# Patient Record
Sex: Female | Born: 1955 | Race: White | Hispanic: No | Smoking: Never smoker
Health system: Southern US, Community
[De-identification: ages and names within clinical notes are randomized; demographics above are authoritative.]

---

## 2017-12-14 ENCOUNTER — Inpatient Hospital Stay (HOSPITAL_COMMUNITY)
Admission: EM | Admit: 2017-12-14 | Discharge: 2017-12-16 | DRG: 084 | Disposition: A | Discharge status: Home Health | Payer: BLUE CROSS/BLUE SHIELD | Attending: Neurosurgery | Admitting: Neurosurgery

## 2017-12-14 ENCOUNTER — Emergency Department (HOSPITAL_COMMUNITY): Payer: BLUE CROSS/BLUE SHIELD

## 2017-12-14 DIAGNOSIS — Y93K1 Activity, walking an animal: Secondary | ICD-10-CM | POA: Diagnosis not present

## 2017-12-14 DIAGNOSIS — W1830XA Fall on same level, unspecified, initial encounter: Secondary | ICD-10-CM | POA: Diagnosis present

## 2017-12-14 DIAGNOSIS — S066X9A Traumatic subarachnoid hemorrhage with loss of consciousness of unspecified duration, initial encounter: Principal | ICD-10-CM | POA: Diagnosis present

## 2017-12-14 DIAGNOSIS — S069X1A Unspecified intracranial injury with loss of consciousness of 30 minutes or less, initial encounter: Secondary | ICD-10-CM

## 2017-12-14 DIAGNOSIS — S069X9A Unspecified intracranial injury with loss of consciousness of unspecified duration, initial encounter: Secondary | ICD-10-CM | POA: Diagnosis present

## 2017-12-14 DIAGNOSIS — S069XAA Unspecified intracranial injury with loss of consciousness status unknown, initial encounter: Secondary | ICD-10-CM | POA: Diagnosis present

## 2017-12-14 LAB — CDS SEROLOGY

## 2017-12-14 LAB — I-STAT CHEM 8, ED
BUN: 32 mg/dL — AB (ref 8–23)
CALCIUM ION: 1.18 mmol/L (ref 1.15–1.40)
CREATININE: 1.4 mg/dL — AB (ref 0.44–1.00)
Chloride: 104 mmol/L (ref 98–111)
GLUCOSE: 232 mg/dL — AB (ref 70–99)
HEMATOCRIT: 41 % (ref 36.0–46.0)
HEMOGLOBIN: 13.9 g/dL (ref 12.0–15.0)
Potassium: 5.4 mmol/L — ABNORMAL HIGH (ref 3.5–5.1)
Sodium: 136 mmol/L (ref 135–145)
TCO2: 23 mmol/L (ref 22–32)

## 2017-12-14 LAB — COMPREHENSIVE METABOLIC PANEL
ALBUMIN: 3.9 g/dL (ref 3.5–5.0)
ALT: 49 U/L — ABNORMAL HIGH (ref 0–44)
ANION GAP: 10 (ref 5–15)
AST: 39 U/L (ref 15–41)
Alkaline Phosphatase: 69 U/L (ref 38–126)
BILIRUBIN TOTAL: 1 mg/dL (ref 0.3–1.2)
BUN: 30 mg/dL — ABNORMAL HIGH (ref 8–23)
CO2: 25 mmol/L (ref 22–32)
Calcium: 9.5 mg/dL (ref 8.9–10.3)
Chloride: 101 mmol/L (ref 98–111)
Creatinine, Ser: 1.42 mg/dL — ABNORMAL HIGH (ref 0.44–1.00)
GFR calc Af Amer: 45 mL/min — ABNORMAL LOW (ref >60)
GFR calc non Af Amer: 39 mL/min — ABNORMAL LOW (ref >60)
GLUCOSE: 243 mg/dL — AB (ref 70–99)
POTASSIUM: 5.5 mmol/L — AB (ref 3.5–5.1)
Sodium: 136 mmol/L (ref 135–145)
TOTAL PROTEIN: 6.4 g/dL — AB (ref 6.5–8.1)

## 2017-12-14 LAB — CBC
HEMATOCRIT: 42.2 % (ref 36.0–46.0)
HEMOGLOBIN: 13.6 g/dL (ref 12.0–15.0)
MCH: 29.7 pg (ref 26.0–34.0)
MCHC: 32.2 g/dL (ref 30.0–36.0)
MCV: 92.1 fL (ref 78.0–100.0)
Platelets: 191 10*3/uL (ref 150–400)
RBC: 4.58 MIL/uL (ref 3.87–5.11)
RDW: 12.3 % (ref 11.5–15.5)
WBC: 8.8 10*3/uL (ref 4.0–10.5)

## 2017-12-14 LAB — PROTIME-INR
INR: 1.16
Prothrombin Time: 14.7 seconds (ref 11.4–15.2)

## 2017-12-14 LAB — TYPE AND SCREEN
ABO/RH(D): A POS
ANTIBODY SCREEN: NEGATIVE

## 2017-12-14 LAB — I-STAT CG4 LACTIC ACID, ED: LACTIC ACID, VENOUS: 0.8 mmol/L (ref 0.5–1.9)

## 2017-12-14 LAB — ETHANOL: Alcohol, Ethyl (B): <10 mg/dL (ref <10)

## 2017-12-14 MED ORDER — SODIUM CHLORIDE 0.9 % IV BOLUS
1000.0000 mL | Freq: Once | INTRAVENOUS | Status: AC
  Administered 2017-12-14: 1000 mL via INTRAVENOUS

## 2017-12-14 MED ORDER — FAMOTIDINE IN NACL 20-0.9 MG/50ML-% IV SOLN
20.0000 mg | Freq: Two times a day (BID) | INTRAVENOUS | Status: DC
  Administered 2017-12-14: 20 mg via INTRAVENOUS
  Filled 2017-12-14 (×3): qty 50

## 2017-12-14 MED ORDER — ONDANSETRON 4 MG PO TBDP: 4.0000 mg | ORAL_TABLET | Freq: Four times a day (QID) | ORAL | Status: DC | PRN

## 2017-12-14 MED ORDER — PANTOPRAZOLE SODIUM 40 MG PO TBEC
40.0000 mg | DELAYED_RELEASE_TABLET | Freq: Two times a day (BID) | ORAL | Status: DC
  Administered 2017-12-15 – 2017-12-16 (×3): 40 mg via ORAL
  Filled 2017-12-14 (×3): qty 1

## 2017-12-14 MED ORDER — LABETALOL HCL 5 MG/ML IV SOLN
10.0000 mg | Freq: Once | INTRAVENOUS | Status: AC
  Administered 2017-12-14: 10 mg via INTRAVENOUS
  Filled 2017-12-14: qty 4

## 2017-12-14 MED ORDER — FENTANYL CITRATE (PF) 100 MCG/2ML IJ SOLN
50.0000 ug | Freq: Once | INTRAMUSCULAR | Status: AC
  Administered 2017-12-14: 50 ug via INTRAVENOUS
  Filled 2017-12-14: qty 2

## 2017-12-14 MED ORDER — FENTANYL CITRATE (PF) 100 MCG/2ML IJ SOLN
25.0000 ug | Freq: Once | INTRAMUSCULAR | Status: AC
  Administered 2017-12-14: 25 ug via INTRAVENOUS
  Filled 2017-12-14: qty 2

## 2017-12-14 MED ORDER — DEXTROSE-NACL 5-0.9 % IV SOLN
INTRAVENOUS | Status: DC
  Administered 2017-12-14 – 2017-12-15 (×2): via INTRAVENOUS

## 2017-12-14 MED ORDER — ONDANSETRON HCL 4 MG/2ML IJ SOLN
4.0000 mg | Freq: Once | INTRAMUSCULAR | Status: AC
  Administered 2017-12-14: 4 mg via INTRAVENOUS
  Filled 2017-12-14: qty 2

## 2017-12-14 MED ORDER — PROCHLORPERAZINE EDISYLATE 10 MG/2ML IJ SOLN
10.0000 mg | Freq: Once | INTRAMUSCULAR | Status: AC
  Administered 2017-12-14: 10 mg via INTRAVENOUS
  Filled 2017-12-14: qty 2

## 2017-12-14 MED ORDER — ACETAMINOPHEN 325 MG PO TABS: 650.0000 mg | ORAL_TABLET | ORAL | Status: DC | PRN

## 2017-12-14 MED ORDER — HYDROMORPHONE HCL 1 MG/ML IJ SOLN: 0.5000 mg | INTRAMUSCULAR | Status: DC | PRN

## 2017-12-14 MED ORDER — PROTHROMBIN COMPLEX CONC HUMAN 500 UNITS IV KIT
4371.0000 [IU] | PACK | Status: AC
  Administered 2017-12-14: 4371 [IU] via INTRAVENOUS
  Filled 2017-12-14: qty 4371

## 2017-12-14 MED ORDER — METOPROLOL TARTRATE 5 MG/5ML IV SOLN: 5.0000 mg | Freq: Four times a day (QID) | INTRAVENOUS | Status: DC | PRN

## 2017-12-14 MED ORDER — HYDROCODONE-ACETAMINOPHEN 5-325 MG PO TABS
1.0000 | ORAL_TABLET | ORAL | Status: DC | PRN
  Administered 2017-12-15 – 2017-12-16 (×6): 1 via ORAL
  Filled 2017-12-14 (×6): qty 1

## 2017-12-14 MED ORDER — ONDANSETRON HCL 4 MG/2ML IJ SOLN: 4.0000 mg | Freq: Four times a day (QID) | INTRAMUSCULAR | Status: DC | PRN

## 2017-12-14 NOTE — H&P (Signed)
Alexa Campbell is an 62 y.o. female.   Chief Complaint: Traumatic brain injury HPI: 62 year old female status post mechanical fall while walking her dog.  Patient struck the back of her head.  Probable brief loss of consciousness.  No hemodynamic instability no history of hypoxia.  Patient with a recall of the events both pre-and post fall.  Patient complains of headache.  Patient with some nausea.  Patient situation complicated by anticoagulation on Eliquis.  Reversal agent given.  Currently patient awake and alert and conversant.  No evidence of other injury.  No past medical history on file.    No family history on file. Social History:  has no tobacco, alcohol, and drug history on file.  Allergies: Allergies not on file   (Not in a hospital admission)  Results for orders placed or performed during the hospital encounter of 12/14/17 (from the past 48 hour(s))  Type and screen [place]     Status: None   Collection Time: 12/14/17  7:24 PM  Result Value Ref Range   ABO/RH(D) A POS    Antibody Screen NEG    Sample Expiration      12/17/2017 Performed at [facility], [place] [address] [place], [place] [address]   ABO/Rh     Status: None (Preliminary result)   Collection Time: 12/14/17  7:24 PM  Result Value Ref Range   ABO/RH(D)      A POS Performed at [facility] [address] [place], [place] [address]   CDS serology     Status: None   Collection Time: 12/14/17  7:26 PM  Result Value Ref Range   CDS serology specimen      SPECIMEN WILL BE HELD FOR 14 DAYS IF TESTING IS REQUIRED    Comment: SPECIMEN WILL BE HELD FOR 14 DAYS IF TESTING IS REQUIRED Performed at [facility], [place] [address] [place], [place] [address]   Comprehensive metabolic panel     Status: Abnormal   Collection Time: 12/14/17  7:26 PM  Result Value Ref Range   Sodium 136 135 - 145 mmol/L   Potassium 5.5 (H) 3.5 - 5.1 mmol/L   Chloride 101 98 - 111 mmol/L   CO2  25 22 - 32 mmol/L   Glucose, Bld 243 (H) 70 - 99 mg/dL   BUN 30 (H) 8 - 23 mg/dL   Creatinine, Ser 1.42 (H) 0.44 - 1.00 mg/dL   Calcium 9.5 8.9 - 10.3 mg/dL   Total Protein 6.4 (L) 6.5 - 8.1 g/dL   Albumin 3.9 3.5 - 5.0 g/dL   AST 39 15 - 41 U/L   ALT 49 (H) 0 - 44 U/L   Alkaline Phosphatase 69 38 - 126 U/L   Total Bilirubin 1.0 0.3 - 1.2 mg/dL   GFR calc non Af Amer 39 (L) >60 mL/min   GFR calc Af Amer 45 (L) >60 mL/min    Comment: (NOTE) The eGFR has been calculated using the CKD EPI equation. This calculation has not been validated in all clinical situations. eGFR's persistently <60 mL/min signify possible Chronic Kidney Disease.    Anion gap 10 5 - 15    Comment: Performed at [facility] [address], [place] [address]  CBC     Status: None   Collection Time: 12/14/17  7:26 PM  Result Value Ref Range   WBC 8.8 4.0 - 10.5 K/uL   RBC 4.58 3.87 - 5.11 MIL/uL  Hemoglobin 13.6 12.0 - 15.0 g/dL   HCT 42.2 36.0 - 46.0 %   MCV 92.1 78.0 - 100.0 fL   MCH 29.7 26.0 - 34.0 pg   MCHC 32.2 30.0 - 36.0 g/dL   RDW 12.3 11.5 - 15.5 %   Platelets 191 150 - 400 K/uL    Comment: Performed at [facility], [place] [address] [place], [place] [address]  Ethanol     Status: None   Collection Time: 12/14/17  7:26 PM  Result Value Ref Range   Alcohol, Ethyl (B) <10 <10 mg/dL    Comment: (NOTE) Lowest detectable limit for serum alcohol is 10 mg/dL. For medical purposes only. Performed at [facility], [place] [address] [place], [place] [address]   Protime-INR     Status: None   Collection Time: 12/14/17  7:26 PM  Result Value Ref Range   Prothrombin Time 14.7 11.4 - 15.2 seconds   INR 1.16     Comment: Performed at [facility] [address], [place], [place] [address]  I-Stat Chem 8, ED     Status: Abnormal   Collection Time: 12/14/17  7:32 PM  Result Value Ref Range   Sodium 136 135 - 145 mmol/L   Potassium 5.4 (H) 3.5 - 5.1 mmol/L   Chloride 104  98 - 111 mmol/L   BUN 32 (H) 8 - 23 mg/dL   Creatinine, Ser 1.40 (H) 0.44 - 1.00 mg/dL   Glucose, Bld 232 (H) 70 - 99 mg/dL   Calcium, Ion 1.18 1.15 - 1.40 mmol/L   TCO2 23 22 - 32 mmol/L   Hemoglobin 13.9 12.0 - 15.0 g/dL   HCT 41.0 36.0 - 46.0 %  I-Stat CG4 Lactic Acid, ED     Status: None   Collection Time: 12/14/17  7:33 PM  Result Value Ref Range   Lactic Acid, Venous 0.80 0.5 - 1.9 mmol/L   Ct Head Wo Contrast  Result Date: 12/14/2017 CLINICAL DATA:  Fall backwards with posterior head injury and loss of consciousness. Anticoagulated. EXAM: CT HEAD WITHOUT CONTRAST CT CERVICAL SPINE WITHOUT CONTRAST TECHNIQUE: Multidetector CT imaging of the head and cervical spine was performed following the standard protocol without intravenous contrast. Multiplanar CT image reconstructions of the cervical spine were also generated. COMPARISON:  None. FINDINGS: CT HEAD FINDINGS Brain: Small amount of subarachnoid hemorrhage in the superior right parietal lobe. No significant mass effect. No intra-axial hemorrhage. No midline shift. No CT evidence of acute infarction. Cerebral volume is age appropriate. No ventriculomegaly. Vascular: No acute abnormality. Skull: No evidence of calvarial fracture. Sinuses/Orbits: The visualized paranasal sinuses are essentially clear. Other: Moderate right posterior scalp subgaleal hematoma. The mastoid air cells are unopacified. CT CERVICAL SPINE FINDINGS Alignment: Mild straightening of the cervical spine. No facet subluxation. Dens is well positioned between the lateral masses of C1. Skull base and vertebrae: No acute fracture. No primary bone lesion or focal pathologic process. Soft tissues and spinal canal: No prevertebral edema. No visible canal hematoma. Disc levels: Marked degenerative disc disease at C5-6. Mild bilateral facet arthropathy. Mild bilateral foraminal stenosis at C5-6. Upper chest: No acute abnormality. Other: Visualized mastoid air cells appear clear. No  discrete thyroid nodules. No pathologically enlarged cervical nodes. IMPRESSION: CT HEAD: 1. Small amount of subarachnoid hemorrhage in the superior right parietal lobe. No significant mass effect. No midline shift. 2. Moderate right posterior scalp hematoma. No evidence of calvarial fracture. CT CERVICAL SPINE: 1. No cervical spine fracture  or subluxation. 2. Marked degenerative disc disease at C5-6 with associated mild bilateral foraminal stenosis at this level. Critical Value/emergent results were called by telephone at the time of interpretation on 12/14/2017 at 9:14 pm to Dr. Gerlene Fee , who verbally acknowledged these results. Electronically Signed   By: Ilona Sorrel M.D.   On: 12/14/2017 21:16   Ct Cervical Spine Wo Contrast  Result Date: 12/14/2017 CLINICAL DATA:  Fall backwards with posterior head injury and loss of consciousness. Anticoagulated. EXAM: CT HEAD WITHOUT CONTRAST CT CERVICAL SPINE WITHOUT CONTRAST TECHNIQUE: Multidetector CT imaging of the head and cervical spine was performed following the standard protocol without intravenous contrast. Multiplanar CT image reconstructions of the cervical spine were also generated. COMPARISON:  None. FINDINGS: CT HEAD FINDINGS Brain: Small amount of subarachnoid hemorrhage in the superior right parietal lobe. No significant mass effect. No intra-axial hemorrhage. No midline shift. No CT evidence of acute infarction. Cerebral volume is age appropriate. No ventriculomegaly. Vascular: No acute abnormality. Skull: No evidence of calvarial fracture. Sinuses/Orbits: The visualized paranasal sinuses are essentially clear. Other: Moderate right posterior scalp subgaleal hematoma. The mastoid air cells are unopacified. CT CERVICAL SPINE FINDINGS Alignment: Mild straightening of the cervical spine. No facet subluxation. Dens is well positioned between the lateral masses of C1. Skull base and vertebrae: No acute fracture. No primary bone lesion or focal pathologic  process. Soft tissues and spinal canal: No prevertebral edema. No visible canal hematoma. Disc levels: Marked degenerative disc disease at C5-6. Mild bilateral facet arthropathy. Mild bilateral foraminal stenosis at C5-6. Upper chest: No acute abnormality. Other: Visualized mastoid air cells appear clear. No discrete thyroid nodules. No pathologically enlarged cervical nodes. IMPRESSION: CT HEAD: 1. Small amount of subarachnoid hemorrhage in the superior right parietal lobe. No significant mass effect. No midline shift. 2. Moderate right posterior scalp hematoma. No evidence of calvarial fracture. CT CERVICAL SPINE: 1. No cervical spine fracture or subluxation. 2. Marked degenerative disc disease at C5-6 with associated mild bilateral foraminal stenosis at this level. Critical Value/emergent results were called by telephone at the time of interpretation on 12/14/2017 at 9:14 pm to Dr. Gerlene Fee , who verbally acknowledged these results. Electronically Signed   By: Ilona Sorrel M.D.   On: 12/14/2017 21:16   Ct Thoracic Spine Wo Contrast  Result Date: 12/14/2017 CLINICAL DATA:  62 year old female with fall and trauma to the back. EXAM: CT THORACIC SPINE WITHOUT CONTRAST TECHNIQUE: Multidetector CT images of the thoracic were obtained using the standard protocol without intravenous contrast. COMPARISON:  Chest radiograph dated 12/14/2017 FINDINGS: Alignment: Normal. Vertebrae: No acute fracture or focal pathologic process. The bones are osteopenic which limits evaluation for fracture. Paraspinal and other soft tissues: Mild atherosclerotic calcification of the aorta. There is coronary vascular calcification. Disc levels: No acute findings. IMPRESSION: No acute/traumatic thoracic spine pathology. Electronically Signed   By: Anner Crete M.D.   On: 12/14/2017 21:19   Dg Chest Port 1 View  Result Date: 12/14/2017 CLINICAL DATA:  Pt. Fell back 5 feet while walking dog today. EXAM: PORTABLE CHEST - 1 VIEW  COMPARISON:  none FINDINGS: Lungs are clear. Heart size and mediastinal contours are within normal limits. Aortic Atherosclerosis (ICD10-170.0). No effusion.  No pneumothorax. Visualized bones unremarkable. IMPRESSION: No acute cardiopulmonary disease. Electronically Signed   By: Lucrezia Europe M.D.   On: 12/14/2017 19:48    Pertinent items noted in HPI and remainder of comprehensive ROS otherwise negative.  Blood pressure (!) 163/82, pulse 88, temperature 97.6  F (36.4 C), temperature source Oral, resp. rate 18, weight 90.7 kg, SpO2 99 %.  Patient is awake and alert.  She is oriented and reasonably appropriate.  Her speech is fluent.  Her judgment and insight appear intact.  Cranial nerve function normal bilaterally.  Motor 5/5 bilaterally.  No pronator drift.  Sensory exam intact.  Examination of her head ears eyes and throat demonstrates evidence of a significant posterior scalp hematoma.  Skin intact.  Oropharynx nasopharynx and external auditory canals clear.  Chest and abdomen benign.  Extremities free from injury deformity. 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  Situation complicated by anticoagulation.  Plan ICU admission with follow-up head CT scan in morning.  Mallie Mussel A Ezequiel Macauley 12/14/2017, 10:04 PM

## 2017-12-14 NOTE — ED Provider Notes (Addendum)
[name] Provider Note MRN:  [id number]  Arrival date & time: 12/14/17     Chief Complaint   Fall History of Present Illness   Alexa Campbell is a 62 y.o. year-old female with unknown past medical history presenting to the ED with chief complaint of fall.  Patient was walking her dog on a gravel road when the dog pulled on the leash, causing her to fall backwards, struck the back of her head against the ground.  Positive loss of consciousness, large hematoma to the back of the head.  Altered mental status per EMS.  Also endorsing neck pain and mid back pain.  Denies chest pain, no abdominal pain, no pain to the arms or legs, no numbness or weakness.  Endorsing nausea, no vomiting.  Moderate to severe headache, diffuse, constant.  Review of Systems  A complete 10 system review of systems was obtained and all systems are negative except as noted in the HPI and PMH.   Patient's Health History   No past medical history on file.    No family history on file.  Social History   Socioeconomic History  . Marital status: Divorced    Spouse name: Not on file  . Number of children: Not on file  . Years of education: Not on file  . Highest education level: Not on file  Occupational History  . Not on file  Social Needs  . Financial resource strain: Not on file  . Food insecurity:    Worry: Not on file    Inability: Not on file  . Transportation needs:    Medical: Not on file    Non-medical: Not on file  Tobacco Use  . Smoking status: Not on file  Substance and Sexual Activity  . Alcohol use: Not on file  . Drug use: Not on file  . Sexual activity: Not on file  Lifestyle  . Physical activity:    Days per week: Not on file    Minutes per session: Not on file  . Stress: Not on file  Relationships  . Social connections:    Talks on phone: Not on file    Gets together: Not on file    Attends religious service: Not on file    Active member  of club or organization: Not on file    Attends meetings of clubs or organizations: Not on file    Relationship status: Not on file  . Intimate partner violence:    Fear of current or ex partner: Not on file    Emotionally abused: Not on file    Physically abused: Not on file    Forced sexual activity: Not on file  Other Topics Concern  . Not on file  Social History Narrative  . Not on file     Physical Exam  Vital Signs and Nursing Notes reviewed Vitals:   12/14/17 2205 12/14/17 2210  BP: (!) 182/68 (!) 173/82  Pulse: 87 88  Resp: 20 13  Temp:    SpO2: 100% 100%    CONSTITUTIONAL: Well-appearing, NAD NEURO:  Alert and oriented x 3, no focal deficits EYES:  eyes equal and reactive ENT/NECK:  no LAD, no JVD CARDIO: Regular rate, well-perfused, normal S1 and S2 PULM:  CTAB no wheezing or rhonchi GI/GU:  normal bowel sounds, non-distended, non-tender MSK/SPINE:  No gross deformities, no edema, minimal midline C ENT spinal tenderness SKIN:  no rash, hematoma to the occipital scalp with matted bloody hair, hemostatic  PSYCH:  Appropriate speech and behavior  Diagnostic and Interventional Summary    EKG Interpretation  Date/Time:  Sunday December 14 2017 19:28:35 EDT Ventricular Rate:  80 PR Interval:    QRS Duration: 167 QT Interval:  444 QTC Calculation: 513 R Axis:   -112 Text Interpretation:  Sinus rhythm Left bundle branch block Confirmed by Geoffery Lyons ([id number]) on 12/15/2017 10:20:43 PM        Labs Reviewed  COMPREHENSIVE METABOLIC PANEL - Abnormal; Notable for the following components:      Result Value   Potassium 5.5 (*)    Glucose, Bld 243 (*)    BUN 30 (*)    Creatinine, Ser 1.42 (*)    Total Protein 6.4 (*)    ALT 49 (*)    GFR calc non Af Amer 39 (*)    GFR calc Af Amer 45 (*)    All other components within normal limits  I-STAT CHEM 8, ED - Abnormal; Notable for the following components:   Potassium 5.4 (*)    BUN 32 (*)    Creatinine, Ser  1.40 (*)    Glucose, Bld 232 (*)    All other components within normal limits  CDS SEROLOGY  CBC  ETHANOL  PROTIME-INR  URINALYSIS, ROUTINE W REFLEX MICROSCOPIC  PROTIME-INR  HIV ANTIBODY (ROUTINE TESTING W REFLEX)  CBC  BASIC METABOLIC PANEL  I-STAT CG4 LACTIC ACID, ED  TYPE AND SCREEN  ABO/RH    CT HEAD WO CONTRAST  Final Result    CT Thoracic Spine Wo Contrast  Final Result    CT CERVICAL SPINE WO CONTRAST  Final Result    DG Chest Port 1 View  Final Result    CT HEAD WO CONTRAST    (Results Pending)    Medications  dextrose 5 %-0.9 % sodium chloride infusion (has no administration in time range)  acetaminophen (TYLENOL) tablet 650 mg (has no administration in time range)  HYDROcodone-acetaminophen (NORCO/VICODIN) 5-325 MG per tablet 1 tablet (has no administration in time range)  HYDROmorphone (DILAUDID) injection 0.5-1 mg (has no administration in time range)  ondansetron (ZOFRAN-ODT) disintegrating tablet 4 mg (has no administration in time range)    Or  ondansetron (ZOFRAN) injection 4 mg (has no administration in time range)  pantoprazole (PROTONIX) EC tablet 40 mg (has no administration in time range)    Or  famotidine (PEPCID) IVPB 20 mg premix (has no administration in time range)  metoprolol tartrate (LOPRESSOR) injection 5 mg (has no administration in time range)  prochlorperazine (COMPAZINE) injection 10 mg (has no administration in time range)  sodium chloride 0.9 % bolus 1,000 mL (0 mLs Intravenous Stopped 12/14/17 2221)  fentaNYL (SUBLIMAZE) injection 50 mcg (50 mcg Intravenous Given 12/14/17 2031)  ondansetron (ZOFRAN) injection 4 mg (4 mg Intravenous Given 12/14/17 2030)  prothrombin complex conc human (KCENTRA) IVPB 4,371 Units (4,371 Units Intravenous New Bag/Given 12/14/17 2207)  labetalol (NORMODYNE,TRANDATE) injection 10 mg (10 mg Intravenous Given 12/14/17 2141)  ondansetron (ZOFRAN) injection 4 mg (4 mg Intravenous Given 12/14/17 2140)  fentaNYL  (SUBLIMAZE) injection 25 mcg (25 mcg Intravenous Given 12/14/17 2208)     Procedures Critical Care Critical Care Documentation Critical care time provided by me (excluding procedures): 38 minutes  Condition necessitating critical care: Traumatic subarachnoid hemorrhage  Components of critical care management: reviewing of prior records, laboratory and imaging interpretation, frequent re-examination and reassessment of vital signs, administration of IV labetalol, IV Kcentra, discussion with consulting services    ED Course  and Medical Decision Making  I have reviewed the triage vital signs and the nursing notes.  Pertinent labs & imaging results that were available during my care of the patient were reviewed by me and considered in my medical decision making (see below for details).  Level 2 trauma, patient reports Eliquis use, concerning head trauma with loss of consciousness, nausea.  Otherwise largely on concerning traumatic exam, primary survey unremarkable, minimal CMT spinal tenderness.  Imaging pending.  Clinical Course as of Dec 15 2303  Wynelle Link Dec 14, 2017  0981 Report of altered mental status per patient seems more appropriate here in the ED, completely nontender abdomen, nontender chest, favoring isolated head injury.   [MB]  2122 Called by radiology, CT revealing traumatic subarachnoid.  Kcentra ordered, neurosurgery consulted.   [MB]    Clinical Course User Index [MB] Sabas Sous, MD    Given IV labetalol to ideally achieve systolic blood pressure greater than 160.  Admitted to neurosurgery service for further care and evaluation.  Elmer Sow. Pilar Plate, MD [facility] Emergency Medicine [facility] [email or web address]  Final Clinical Impressions(s) / ED Diagnoses     ICD-10-CM   1. Traumatic subarachnoid hemorrhage without open intracranial wound with loss of consciousness, initial encounter [facility]) X91.4N8G     ED Discharge Orders    None           Sabas Sous, MD 12/14/17 9562    Sabas Sous, MD 12/22/17 1740

## 2017-12-14 NOTE — Progress Notes (Signed)
Chaplain visited briefly with pt and gave assurance. Pt shared name of friend who is coming to the hospital.  Pt's dog pulled her down, causing her fall. Friend Clarisa FlingDiana Biggs is on the way to hospital after securing dog.  Chaplain Lynnell ChadVirginia Ishi [facility] Spirit[facility]

## 2017-12-14 NOTE — ED Notes (Addendum)
[facility] Dr [facility] just spoke to pt and family. Advised them plan of care.

## 2017-12-14 NOTE — ED Notes (Signed)
Pt in CT.

## 2017-12-14 NOTE — ED Triage Notes (Signed)
Pt arrived via [facility] after fall when walking her dog.  EMS reports pt's dog pulled leash causing pt to fall backwards +LOC, +blood thinners, EMS reports pt was altered on their arrival.  C-collar on and aligned.  Large hematoma to back of head.

## 2017-12-15 ENCOUNTER — Inpatient Hospital Stay (HOSPITAL_COMMUNITY): Payer: BLUE CROSS/BLUE SHIELD

## 2017-12-15 ENCOUNTER — Other Ambulatory Visit: Payer: Self-pay

## 2017-12-15 ENCOUNTER — Encounter (HOSPITAL_COMMUNITY): Payer: Self-pay

## 2017-12-15 LAB — CBC
HEMATOCRIT: 40.9 % (ref 36.0–46.0)
HEMOGLOBIN: 13.3 g/dL (ref 12.0–15.0)
MCH: 30.2 pg (ref 26.0–34.0)
MCHC: 32.5 g/dL (ref 30.0–36.0)
MCV: 93 fL (ref 78.0–100.0)
Platelets: 181 10*3/uL (ref 150–400)
RBC: 4.4 MIL/uL (ref 3.87–5.11)
RDW: 12.2 % (ref 11.5–15.5)
WBC: 10.3 10*3/uL (ref 4.0–10.5)

## 2017-12-15 LAB — BASIC METABOLIC PANEL
Anion gap: 8 (ref 5–15)
BUN: 28 mg/dL — ABNORMAL HIGH (ref 8–23)
CHLORIDE: 104 mmol/L (ref 98–111)
CO2: 25 mmol/L (ref 22–32)
Calcium: 9 mg/dL (ref 8.9–10.3)
Creatinine, Ser: 1.2 mg/dL — ABNORMAL HIGH (ref 0.44–1.00)
GFR calc Af Amer: 55 mL/min — ABNORMAL LOW (ref >60)
GFR calc non Af Amer: 48 mL/min — ABNORMAL LOW (ref >60)
Glucose, Bld: 389 mg/dL — ABNORMAL HIGH (ref 70–99)
POTASSIUM: 4.9 mmol/L (ref 3.5–5.1)
SODIUM: 137 mmol/L (ref 135–145)

## 2017-12-15 LAB — PROTIME-INR
INR: 1.03
Prothrombin Time: 13.4 seconds (ref 11.4–15.2)

## 2017-12-15 LAB — ABO/RH: ABO/RH(D): A POS

## 2017-12-15 LAB — GLUCOSE, CAPILLARY
GLUCOSE-CAPILLARY: 229 mg/dL — AB (ref 70–99)
GLUCOSE-CAPILLARY: 82 mg/dL (ref 70–99)
GLUCOSE-CAPILLARY: 309 mg/dL — AB (ref 70–99)
Glucose-Capillary: 401 mg/dL — ABNORMAL HIGH (ref 70–99)
Glucose-Capillary: 333 mg/dL — ABNORMAL HIGH (ref 70–99)
Glucose-Capillary: 239 mg/dL — ABNORMAL HIGH (ref 70–99)

## 2017-12-15 LAB — MRSA PCR SCREENING: MRSA BY PCR: NEGATIVE

## 2017-12-15 LAB — HIV ANTIBODY (ROUTINE TESTING W REFLEX): HIV Screen 4th Generation wRfx: NONREACTIVE

## 2017-12-15 MED ORDER — INSULIN ASPART 100 UNIT/ML ~~LOC~~ SOLN
0.0000 [IU] | SUBCUTANEOUS | Status: DC
  Administered 2017-12-15: 7 [IU] via SUBCUTANEOUS
  Administered 2017-12-15: 15 [IU] via SUBCUTANEOUS
  Administered 2017-12-15: 7 [IU] via SUBCUTANEOUS
  Administered 2017-12-15: 20 [IU] via SUBCUTANEOUS
  Administered 2017-12-16 (×2): 4 [IU] via SUBCUTANEOUS

## 2017-12-15 NOTE — Evaluation (Addendum)
Physical Therapy Evaluation/Vestibular Assessment Patient Details Name: Alexa Campbell MRN: [id number] DOB: 03/12/1956 Today's Date: 12/15/2017   History of Present Illness  62 yo was walking her dog on a gravel road when the dog pulled on the leash, causing her to fall backwards, struck the back of her head against the ground.  Positive loss of consciousness, large hematoma to the back of the head.  Altered mental status per EMS. CT + Small amount of subarachnoid hemorrhage in the superior right parietal area. No other significant PMH listed in chart.  Clinical Impression  Pt showing some potential signs of very mild R posterior canal BPPV (I would like to re-test in AM with bed in more of trendelenburg or off of the end of the bed as I only saw posterior signs and symptoms with R roll test).  She has some mild balance checks during gait especially with head turns and turning her body.  She has a very supportive daughter who will stay with her during her transition home to ensure that she is feeling better.  If able, a vestibular home therapist is preferred at d/c.  PT to follow acutely for deficits listed below.       Follow Up Recommendations Home health PT;Supervision for mobility/OOB(vestibular HHPT if able)    Equipment Recommendations  None recommended by PT    Recommendations for Other Services   NA     Precautions / Restrictions Precautions Precautions: Fall;Other (comment) Precaution Comments: mildly unsteady on her feet. Pt is light sensative, borrowed her best friend's sun glasses.       Mobility  Bed Mobility Overal bed mobility: Modified Independent                Transfers Overall transfer level: Needs assistance   Transfers: Sit to/from Stand Sit to Stand: Min guard         General transfer comment: Min guard assist for balance, verbal cues to stand for a minute before continuing on to gait.   Ambulation/Gait Ambulation/Gait assistance: Min guard Gait  Distance (Feet): 150 Feet Assistive device: None Gait Pattern/deviations: Step-through pattern;Staggering right;Staggering left     General Gait Details: Pt with mildly staggering gait pattern, increased with head turns.       Balance Overall balance assessment: Needs assistance Sitting-balance support: Feet supported;No upper extremity supported Sitting balance-Leahy Scale: Good     Standing balance support: No upper extremity supported Standing balance-Leahy Scale: Good         12/15/17 2212  Vestibular Assessment  General Observation eye slignment normal, head trauma to the posterior aspect of her head with (+) LOC, (+) bleed, wears glasses for distance vision, but states she needs to be checked because she cannot see things up close (PTA as well)  Symptom Behavior  Type of Dizziness Spinning  Frequency of Dizziness intermittent  Duration of Dizziness short  Aggravating Factors Lying supine (transitioning to supine)  Relieving Factors Head stationary  Occulomotor Exam  Occulomotor Alignment Normal  Spontaneous Absent  Gaze-induced Absent  Smooth Pursuits Saccades  Saccades Intact  Vestibulo-Occular Reflex  VOR 1 Head Only (x 1 viewing) slow horizontally, more effortful and symptomatic vertically.   Positional Testing  Dix-Hallpike Dix-Hallpike Right;Dix-Hallpike Left  Horizontal Canal Testing Horizontal Canal Right;Horizontal Canal Left  Dix-Hallpike Right  Dix-Hallpike Right Duration 0  Dix-Hallpike Right Symptoms No nystagmus  Dix-Hallpike Left  Dix-Hallpike Left Duration 0  Dix-Hallpike Left Symptoms No nystagmus  Horizontal Canal Right  Horizontal Canal Right Duration 5  Horizontal Canal Right Symptoms Other (comment) (right upwardly rotating)  Horizontal Canal Left  Horizontal Canal Left Duration 0  Horizontal Canal Left Symptoms Normal                            Pertinent Vitals/Pain Pain Assessment: Faces Faces Pain Scale: Hurts little  more Pain Location: head Pain Descriptors / Indicators: Aching Pain Intervention(s): Limited activity within patient's tolerance;Monitored during session;Repositioned    Home Living Family/patient expects to be discharged to:: Private residence Living Arrangements: Alone Available Help at Discharge: Available PRN/intermittently;Available 24 hours/day(daughter can possibly stay for a couple of days) Type of Home: House Home Access: Stairs to enter Entrance Stairs-Rails: Left Entrance Stairs-Number of Steps: 3-4 Home Layout: One level Home Equipment: (walking stick)      Prior Function Level of Independence: Independent         Comments: drives; enjoys being outdoors; hiking     Hand Dominance   Dominant Hand: Right    Extremity/Trunk Assessment   Upper Extremity Assessment Upper Extremity Assessment: Defer to OT evaluation    Lower Extremity Assessment Lower Extremity Assessment: Overall WFL for tasks assessed    Cervical / Trunk Assessment Cervical / Trunk Assessment: Other exceptions Cervical / Trunk Exceptions: Pt reports some cervical spine soreness.   Communication   Communication: No difficulties  Cognition Arousal/Alertness: Awake/alert Behavior During Therapy: WFL for tasks assessed/performed Overall Cognitive Status: Impaired/Different from baseline Area of Impairment: Attention;Problem solving                   Current Attention Level: Selective         Problem Solving: Slow processing(very mildly slow)               Assessment/Plan    PT Assessment Patient needs continued PT services  PT Problem List Decreased activity tolerance;Decreased balance;Decreased mobility;Decreased knowledge of use of DME;Decreased knowledge of precautions       PT Treatment Interventions DME instruction;Gait training;Stair training;Functional mobility training;Therapeutic activities;Balance training;Therapeutic exercise;Patient/family education    PT  Goals (Current goals can be found in the Care Plan section)  Acute Rehab PT Goals Patient Stated Goal: to go home and be independent PT Goal Formulation: With patient Time For Goal Achievement: 12/29/17 Potential to Achieve Goals: Good    Frequency Min 4X/week           AM-PAC PT "6 Clicks" Daily Activity  Outcome Measure Difficulty turning over in bed (including adjusting bedclothes, sheets and blankets)?: None Difficulty moving from lying on back to sitting on the side of the bed? : None Difficulty sitting down on and standing up from a chair with arms (e.g., wheelchair, bedside commode, etc,.)?: Unable Help needed moving to and from a bed to chair (including a wheelchair)?: A Little Help needed walking in hospital room?: A Little Help needed climbing 3-5 steps with a railing? : A Little 6 Click Score: 18    End of Session Equipment Utilized During Treatment: Gait belt Activity Tolerance: Patient limited by pain Patient left: in bed;with call bell/phone within reach;with family/visitor present   PT Visit Diagnosis: Unsteadiness on feet (R26.81);Difficulty in walking, not elsewhere classified (R26.2)    Time: 1610-9604 PT Time Calculation (min) (ACUTE ONLY): 22 min   Charges:           Lurena Joiner B. Tytan Sandate, PT, DPT  Acute Rehabilitation [phone] pager [phone]) [phone] office   PT Evaluation $PT Eval Low Complexity:  1 Low          12/15/2017, 10:20 PM

## 2017-12-15 NOTE — Progress Notes (Signed)
12/15/2017 PT evaluation complete.  Full note to follow.  Pt is mildly unsteady on her feet, light sensitive with HA.  Recommend HHPT f/u at discharge.  Her daughter will stay with her for a few days after she goes home.   Thanks,  Rollene Rotundaebecca B. Houa Nie, PT, DPT  Acute Rehabilitation [phone] pager [phone] office

## 2017-12-15 NOTE — Progress Notes (Signed)
Patient feels better today.  Still with some headache but controlled with oral medications.  Patient does note some intermittent vertigo.  No other symptoms currently.  Vital signs are stable.  She is afebrile.  She is awake and alert.  She is oriented and appropriate.  Speech is fluent.  Motor and sensory function intact.  Follow-up head CT scan with resolution of small traumatic subarachnoid hemorrhage and no evidence of significant contusion or other complication.  Overall doing well following mechanical fall.  Mobilize further today.  Probable discharge home tomorrow.

## 2017-12-15 NOTE — Progress Notes (Signed)
Tried calling for report x1

## 2017-12-15 NOTE — Progress Notes (Signed)
Inpatient Diabetes Program Recommendations  AACE/ADA: New Consensus Statement on Inpatient Glycemic Control (2015)  Target Ranges:  Prepandial:   less than 140 mg/dL      Peak postprandial:   less than 180 mg/dL (1-2 hours)      Critically ill patients:  140 - 180 mg/dL   Lab Results  Component Value Date   GLUCAP 229 (H) 12/15/2017    Review of Glycemic ControlResults for Alexa JewelsHORNE, Alexa (MRN [id number]) as of 12/15/2017 10:17  Ref. Range 12/14/2017 19:26 12/14/2017 19:32 12/15/2017 02:46  Glucose Latest Ref Range: 70 - 99 mg/dL 409243 (H) 811232 (H) 914389 (H)    Diabetes history: Type 2 DM  Outpatient Diabetes medications: Metformin 1000 mg bid Current orders for Inpatient glycemic control:  Novolog resistant q 4 hours Inpatient Diabetes Program Recommendations:   Note elevated blood sugars.  Please check A1C to determine pre-hospitalization glycemic control.  If appropriate, consider adding Lantus 15 units daily?  Thanks  Beryl MeagerJenny Yarlin Breisch, RN, BC-ADM Inpatient Diabetes Coordinator Pager [phone] (8a-5p)

## 2017-12-15 NOTE — Progress Notes (Signed)
Occupational Therapy Evaluation Patient Details Name: Alexa Campbell MRN: [id number] DOB: 1956/02/15 Today's Date: 12/15/2017    History of Present Illness 62 yo was walking her dog on a gravel road when the dog pulled on the leash, causing her to fall backwards, struck the back of her head against the ground.  Positive loss of consciousness, large hematoma to the back of the head.  Altered mental status per EMS. CT + Small amount of subarachnoid hemorrhage in the superior right parietal area.    Clinical Impression   Pt lives alone in [place] and was in [place] working on her property when she fell. Pt completing ADL and mobility for ADL with S after set up.No complaints of dizziness with mobility at this time. Assessed with the [facility] Cognitive Assessment and received a score of 28/30, which is considered normal. Slow processing noted. Daughter present for end of session and stated she could provide S initially. Recommend follow up with HHOT to facilitate safe return to independence with IADL tasks. Recommend initial supervision with financial and medication management. Pt/daughter verbalized understanding. Will follow acutely to facilitate safe DC home.     Follow Up Recommendations  Home health OT;Supervision/Assistance - 24 hour(initially)    Equipment Recommendations  3 in 1 bedside commode(to use as shower seat)    Recommendations for Other Services       Precautions / Restrictions Precautions Precautions: Fall      Mobility Bed Mobility Overal bed mobility: Modified Independent                Transfers Overall transfer level: Needs assistance   Transfers: Sit to/from Stand Sit to Stand: Supervision         General transfer comment: minimally unsteady initially from "being in bed"; no complaints of dizziness with movement    Balance                                           ADL either performed or assessed with clinical judgement   ADL  Overall ADL's : Needs assistance/impaired                                     Functional mobility during ADLs: Min guard General ADL Comments: Pt overall set up/supervision for ADL tasks; Pt able to name medicaitons and schedule for taking meds     Vision Baseline Vision/History: Wears glasses Wears Glasses: Distance only Patient Visual Report: (Pt has an "eye issue" at baseline; vision more affected on R) Vision Assessment?: Yes Eye Alignment: Within Functional Limits Ocular Range of Motion: Within Functional Limits Alignment/Gaze Preference: Within Defined Limits Tracking/Visual Pursuits: Able to track stimulus in all quads without difficulty Saccades: Within functional limits Convergence: Within functional limits Additional Comments: history to retinal bleeding per pt; sees [facility] Junie Panning at [facility]     Perception Perception Comments: appears intact. will further assess   Praxis Praxis Praxis tested?: Within functional limits    Pertinent Vitals/Pain Pain Assessment: 0-10 Pain Score: 4  Pain Location: head Pain Descriptors / Indicators: Aching Pain Intervention(s): Limited activity within patient's tolerance;Premedicated before session     Hand Dominance Right   Extremity/Trunk Assessment Upper Extremity Assessment Upper Extremity Assessment: Overall WFL for tasks assessed   Lower Extremity Assessment Lower Extremity Assessment: Defer to PT evaluation  Cervical / Trunk Assessment Cervical / Trunk Assessment: Normal   Communication Communication Communication: No difficulties   Cognition Arousal/Alertness: Awake/alert Behavior During Therapy: WFL for tasks assessed/performed Overall Cognitive Status: Impaired/Different from baseline Area of Impairment: Attention;Problem solving                   Current Attention Level: Selective         Problem Solving: Slow processing General Comments: Pt scored a 28 on the MOCA. Difficulty  with verbal fluency and spatial orientation of hands on clock face.    General Comments       Exercises     Shoulder Instructions      Home Living Family/patient expects to be discharged to:: Private residence Living Arrangements: Alone Available Help at Discharge: Available PRN/intermittently;Available 24 hours/day(daughter can possibly stay for a couple of days) Type of Home: House Home Access: Stairs to enter Enter[employer] CorporationEntrance Stairs-Number of Steps: 3-4 Entrance Stairs-Rails: Left Home Layout: One level     Bathroom Shower/Tub: Tub/shower unit;Walk-in shower   Bathroom Toilet: Standard Bathroom Accessibility: Yes How Accessible: Accessible via walker            Prior Functioning/Environment Level of Independence: Independent        Comments: drives; enjoys being outdoors; hiking        OT Problem List: Impaired vision/perception;Decreased knowledge of use of DME or AE;Decreased cognition;Pain      OT Treatment/Interventions: Self-care/ADL training;DME and/or AE instruction;Therapeutic activities;Cognitive remediation/compensation;Visual/perceptual remediation/compensation;Patient/family education;Balance training    OT Goals(Current goals can be found in the care plan section) Acute Rehab OT Goals Patient Stated Goal: to go home and be independent OT Goal Formulation: With patient Time For Goal Achievement: 12/29/17 Potential to Achieve Goals: Good  OT Frequency: Min 2X/week   Barriers to D/C:            Co-evaluation              AM-PAC PT "6 Clicks" Daily Activity     Outcome Measure Help from another person eating meals?: None Help from another person taking care of personal grooming?: None Help from another person toileting, which includes using toliet, bedpan, or urinal?: A Little Help from another person bathing (including washing, rinsing, drying)?: A Little Help from another person to put on and taking off regular upper body clothing?:  None Help from another person to put on and taking off regular lower body clothing?: A Little 6 Click Score: 21   End of Session Nurse Communication: Mobility status;Other (comment)(small wound/oozing blood from head wound)  Activity Tolerance: Patient tolerated treatment well Patient left: in chair;with call bell/phone within reach;with family/visitor present  OT Visit Diagnosis: Unsteadiness on feet (R26.81);Other symptoms and signs involving cognitive function;Pain Pain - part of body: (head)                Time: 1400-1440 OT Time Calculation (min): 40 min Charges:  OT General Charges $OT Visit: 1 Visit OT Evaluation $OT Eval Moderate Complexity: 1 Mod OT Treatments $Self Care/Home Management : 8-22 mins $Therapeutic Activity: 8-22 mins  Luisa DagoHilary Sebastiana Wuest, OT/L   Acute OT Clinical Specialist Acute Rehabilitation Services Pager [phone] Office [phone]   [facility] Psych[facility] HospitalWARD,HILLARY 12/15/2017, 3:11 PM

## 2017-12-15 NOTE — Care Management Note (Addendum)
Case Management Note  Patient Details  Name: Alexa Campbell MRN: [id number] Date of Birth: 11/09/1955  Subjective/Objective:                    Action/Campbell:  PT recommending home health PT. Will need MD orders for same. Attempted to see patient , patient in rest room . Expected Discharge Date:                  Expected Discharge Campbell:     In-House Referral:     Discharge planning Services  CM Consult  Post Acute Care Choice:    Choice offered to:     DME Arranged:  3-N-1 DME Agency:  [facility]  HH Arranged:    HH Agency:     Status of Service:     If discussed at Long Length of Stay Meetings, dates discussed:    Additional Comments:  Alexa PlanWile, Mendy Chou Marie, RN 12/15/2017, 4:22 PM

## 2017-12-16 LAB — GLUCOSE, CAPILLARY
GLUCOSE-CAPILLARY: 178 mg/dL — AB (ref 70–99)
Glucose-Capillary: 111 mg/dL — ABNORMAL HIGH (ref 70–99)
Glucose-Capillary: 169 mg/dL — ABNORMAL HIGH (ref 70–99)

## 2017-12-16 MED ORDER — SODIUM CHLORIDE 0.9 % IV SOLN
INTRAVENOUS | Status: DC | PRN
  Administered 2017-12-16: 250 mL via INTRAVENOUS

## 2017-12-16 NOTE — Discharge Instructions (Signed)
[facility] [phone]

## 2017-12-16 NOTE — Plan of Care (Signed)
  Problem: Education: Goal: Knowledge of General Education information will improve Description Including pain rating scale, medication(s)/side effects and non-pharmacologic comfort measures Outcome: Progressing Note:  POC reviewed with pt./daughter.   

## 2017-12-16 NOTE — Discharge Summary (Signed)
Physician Discharge Summary  Patient ID: Alexa Campbell MRN: [id number] DOB/AGE: 06-13-1955 62 y.o.  Admit date: 12/14/2017 Discharge date: 12/16/2017  Admission Diagnoses:  Discharge Diagnoses:  Active Problems:   TBI (traumatic brain injury) [facility])   Discharged Condition: good  Hospital Course: The patient was admitted for evaluation of traumatic brain injury after mechanical fall.  Situation complicated by anticoagulation.  Patient's confusion and headache much improved after the first day.  Now up ambulating without difficulty.  Still with some occasional positional vertigo.  Follow-up PET CT scan demonstrates resolution of post traumatic subarachnoid hemorrhage without any evidence of intracranial bleeding otherwise.  Consults:   Significant Diagnostic Studies:   Treatments:   Discharge Exam: Blood pressure 131/61, pulse 64, temperature 97.9 F (36.6 C), temperature source Oral, resp. rate 16, height 5\' 7"  (1.702 m), weight 91.2 kg, SpO2 98 %. Awake and alert.  Oriented and appropriate.  Cranial nerve function intact.  Motor and sensory function extremities normal.  Disposition: Discharge disposition: 01-Home or Self Care       Discharge Instructions    Face-to-face encounter (required for Medicare/Medicaid patients)   Complete by:  As directed    I Temple Pacini certify that this patient is under my care and that I, or a nurse practitioner or physician's assistant working with me, had a face-to-face encounter that meets the physician face-to-face encounter requirements with this patient on 12/16/2017. The encounter with the patient was in whole, or in part for the following medical condition(s) which is the primary reason for home health care (List medical condition): Rheumatic brain injury   The encounter with the patient was in whole, or in part, for the following medical condition, which is the primary reason for home health care:  Traumatic brain injury   I certify that,  based on my findings, the following services are medically necessary home health services:  Physical therapy   Reason for Medically Necessary Home Health Services:  Therapy- Investment banker, operational, Teacher, early years/pre and Stair Training   My clinical findings support the need for the above services:  Unsafe ambulation due to balance issues   Further, I certify that my clinical findings support that this patient is homebound due to:  Unsafe ambulation due to balance issues   Home Health   Complete by:  As directed    To provide the following care/treatments:  PT     Allergies as of 12/16/2017   No Known Allergies     Medication List    STOP taking these medications   ELIQUIS 5 MG Tabs tablet Generic drug:  apixaban     TAKE these medications   aspirin 81 MG chewable tablet Chew 81 mg by mouth daily.   atorvastatin 20 MG tablet Commonly known as:  LIPITOR Take 20 mg by mouth daily at 6 PM.   escitalopram 10 MG tablet Commonly known as:  LEXAPRO Take 10 mg by mouth daily.   ezetimibe 10 MG tablet Commonly known as:  ZETIA Take 10 mg by mouth daily.   losartan 50 MG tablet Commonly known as:  COZAAR Take 50 mg by mouth daily.   metFORMIN 1000 MG tablet Commonly known as:  GLUCOPHAGE Take 1,000 mg by mouth 2 (two) times daily.   metoprolol tartrate 100 MG tablet Commonly known as:  LOPRESSOR Take 100 mg by mouth 2 (two) times daily.            Durable Medical Equipment  (From admission, onward)  Start     Ordered   12/15/17 1622  For home use only DME 3 n 1  Once     12/15/17 1622         Follow-up Information    [facility] .   [facility]:  [facility] Contact information: [address] [place] [id number] [phone]       Julio SicksPool, Adolpho Meenach, MD. Schedule an appointment as soon as possible for a visit in 2 week(s).   Specialty:  Neurosurgery Contact information: [address] [place] [address] [phone]           Signed: Temple PaciniHenry A Shine Mikes 12/16/2017, 10:54 AM

## 2017-12-16 NOTE — Progress Notes (Signed)
Physical Therapy Treatment/Vestibular Treatment Patient Details Name: Michail JewelsDelores Nay MRN: [id number] DOB: 08/22/1955 Today's Date: 12/16/2017    History of Present Illness 62 yo was walking her dog on a gravel road when the dog pulled on the leash, causing her to fall backwards, struck the back of her head against the ground.  Positive loss of consciousness, large hematoma to the back of the head.  Altered mental status per EMS. CT + Small amount of subarachnoid hemorrhage in the superior right parietal area. No other significant PMH listed in chart.    PT Comments    Pt with (+) L posterior canalolithiasis treated with Epley's x 3 without resolution of symptoms or nystagmus intensity (and + N/V) making me think that this may be cupulolithiasis despite shorter duration of nystagmus.  My other thought is that I need to get more neck extension, but cannot due to head and neck pain in testing and treating positions.  Pt to be followed up by HHPT hopefully who specializes in vestibular treatment and assessment.  Daughter given emesis basins for the trip home today.  PT to follow acutely until d/c confirmed.     Follow Up Recommendations  Home health PT;Supervision for mobility/OOB(vestibular specialist if able)     Equipment Recommendations  None recommended by PT    Recommendations for Other Services   NA      Precautions / Restrictions Precautions Precautions: Fall;Other (comment) Precaution Comments: mildly unsteady on her feet.     Mobility  Bed Mobility Overal bed mobility: Independent                Transfers Overall transfer level: Needs assistance Equipment used: None Transfers: Sit to/from Stand Sit to Stand: Supervision         General transfer comment: supervision for safety  Ambulation/Gait Ambulation/Gait assistance: Supervision Gait Distance (Feet): 15 Feet Assistive device: None Gait Pattern/deviations: Step-through pattern;Staggering right;Staggering  left     General Gait Details: mildly staggering gait pattern, in room gait only as focus of session was on vestibular assessment and treatment.           Balance Overall balance assessment: Needs assistance Sitting-balance support: Feet supported;No upper extremity supported Sitting balance-Leahy Scale: Good     Standing balance support: Single extremity supported;Bilateral upper extremity supported;No upper extremity supported Standing balance-Leahy Scale: Good                              Cognition Arousal/Alertness: Awake/alert Behavior During Therapy: WFL for tasks assessed/performed Overall Cognitive Status: Impaired/Different from baseline Area of Impairment: Problem solving                             Problem Solving: Slow processing General Comments: Pt is slow to process at times, other times remembering things that PT forgot.             Pertinent Vitals/Pain Pain Assessment: Faces Faces Pain Scale: Hurts even more Pain Location: head Pain Descriptors / Indicators: Aching Pain Intervention(s): Limited activity within patient's tolerance;Monitored during session;Repositioned           PT Goals (current goals can now be found in the care plan section) Acute Rehab PT Goals Patient Stated Goal: to go home and be independent Progress towards PT goals: Progressing toward goals    Frequency    Min 4X/week      PT Plan Current plan  remains appropriate       AM-PAC PT "6 Clicks" Daily Activity  Outcome Measure  Difficulty turning over in bed (including adjusting bedclothes, sheets and blankets)?: None Difficulty moving from lying on back to sitting on the side of the bed? : None Difficulty sitting down on and standing up from a chair with arms (e.g., wheelchair, bedside commode, etc,.)?: A Little Help needed moving to and from a bed to chair (including a wheelchair)?: A Little Help needed walking in hospital room?: A  Little Help needed climbing 3-5 steps with a railing? : A Little 6 Click Score: 20    End of Session   Activity Tolerance: Patient limited by pain;Other (comment)(and nausea) Patient left: in bed;Other (comment)(seated EOB with daughter and RN in the room. ) Nurse Communication: Mobility status PT Visit Diagnosis: Unsteadiness on feet (R26.81);Difficulty in walking, not elsewhere classified (R26.2);BPPV BPPV - Right/Left : Right     Time: 1111-1207 PT Time Calculation (min) (ACUTE ONLY): 56 min  Charges:  $Therapeutic Activity: 38-52 mins $Canalith Rep Proc: 8-22 mins                    Hania Cerone B. Yida Hyams, PT, DPT  Acute Rehabilitation [phone] pager #[phone] office   12/16/2017, 12:48 PM

## 2017-12-16 NOTE — Care Management Note (Addendum)
Case Management Note  Patient Details  Name: Michail JewelsDelores Kawecki MRN: [id number] Date of Birth: 12/17/1955  Subjective/Objective:                    Action/Plan:  Spoke with patient and daughter at bedside. Confirmed face sheet information . [facility] .   Provided list of home health agencies. [facility] [phone]. Called same spoke with Annice PihJackie referral accepted once MD ( texted)  is able to sign home health orders.   Once orders sign NCM [facility] [phone].   [facility]. Expected Discharge Date:                  Expected Discharge Plan:  Home w Home Health Services  In-House Referral:     Discharge planning Services  CM Consult  Post Acute Care Choice:  Home Health, Durable Medical Equipment Choice offered to:  Patient, Adult Children  DME Arranged:  3-N-1 DME Agency:  [facility]  HH Arranged:  PT HH Agency:     Status of Service:  In process, will continue to follow  If discussed at Long Length of Stay Meetings, dates discussed:    Additional Comments:  Kingsley PlanWile, Mileydi Milsap Marie, RN 12/16/2017, 10:19 AM

## 2019-07-22 IMAGING — DX DG CHEST 1V PORT
1 series · 1 of 1 positions shown · non-contrast
Comparison: none

CLINICAL DATA: Pt. Fell back 5 feet while walking dog today.

EXAM:
PORTABLE CHEST - 1 VIEW

[chest]
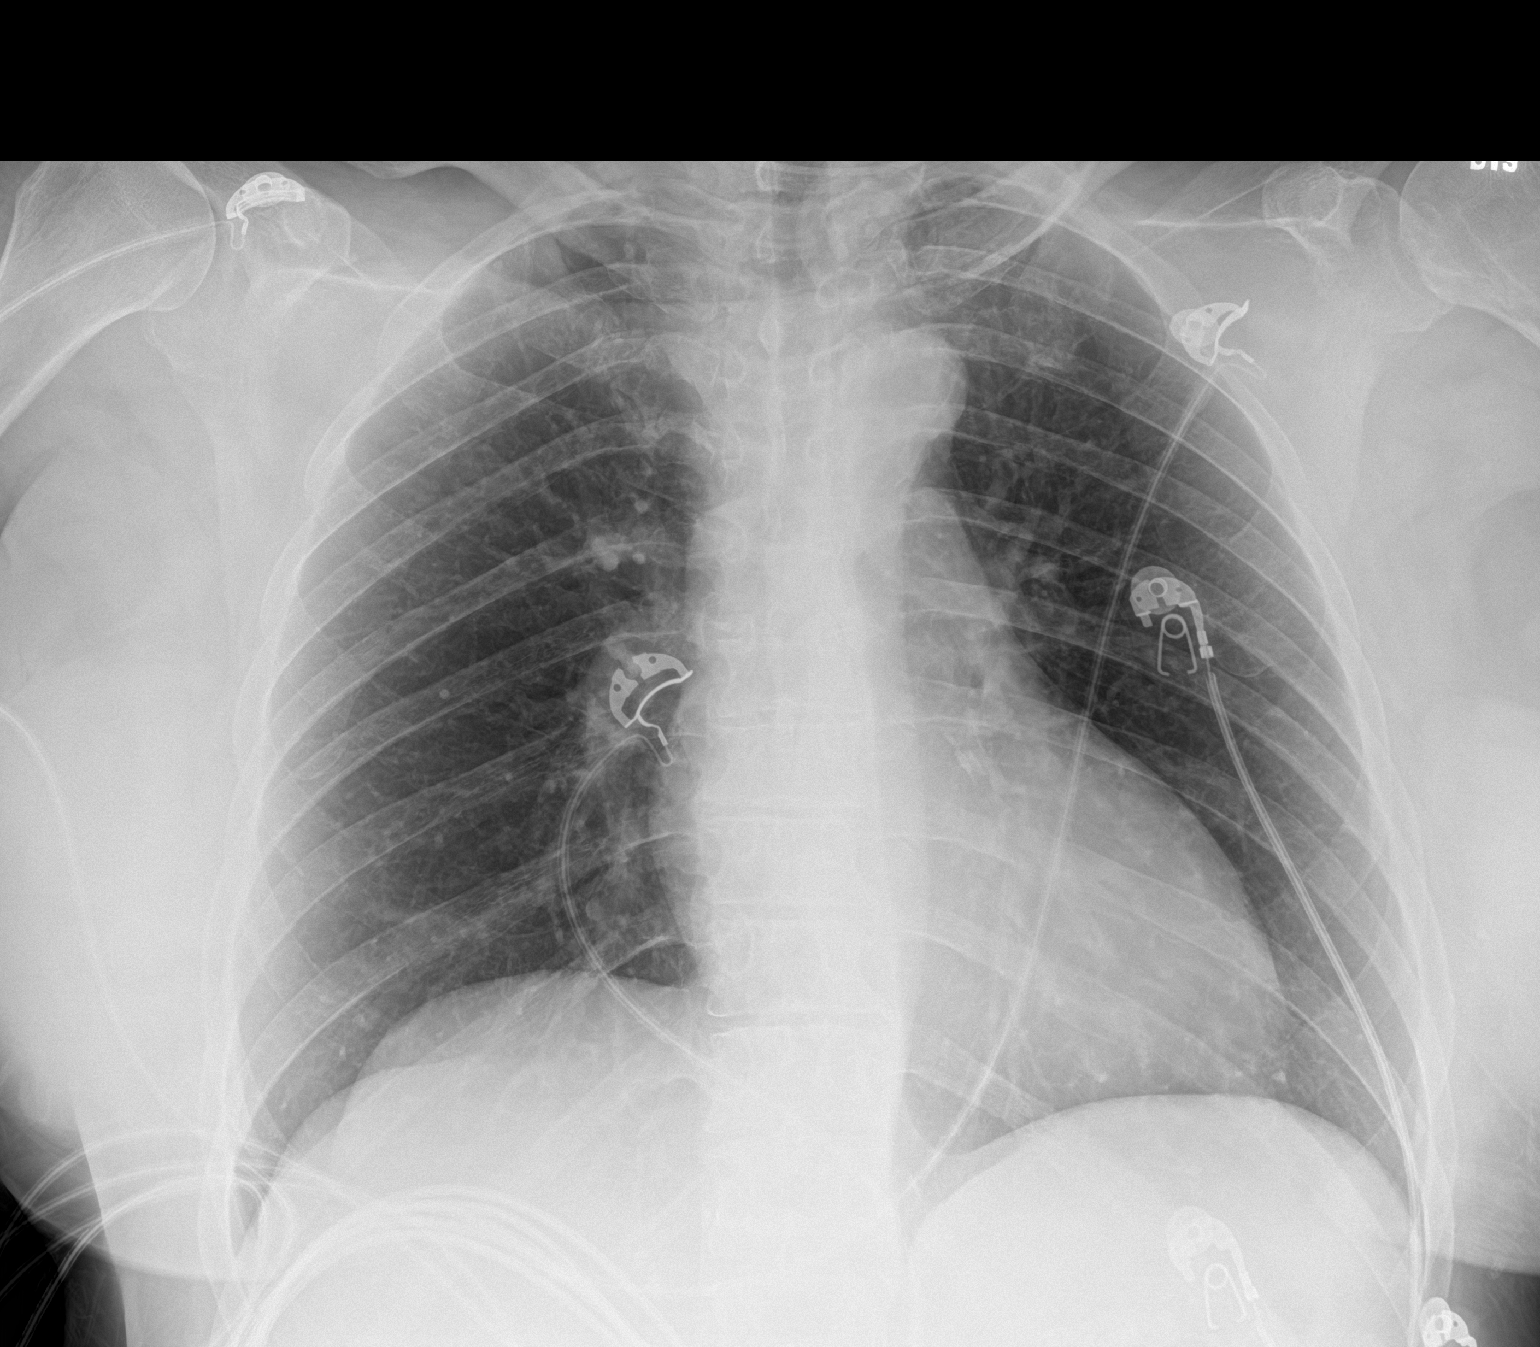

[1 of 1 positions shown; findings below may reference images not displayed]

FINDINGS: Lungs are clear.

Heart size and mediastinal contours are within normal limits. Aortic
Atherosclerosis ([id number]).

No effusion.  No pneumothorax.

Visualized bones unremarkable.
IMPRESSION: No acute cardiopulmonary disease.

## 2019-07-22 IMAGING — CT CT T SPINE W/O CM
3 of 10 series · 8 of 33 positions shown, 9 images · non-contrast
Comparison: Chest radiograph dated 12/14/2017

CLINICAL DATA: 61-year-old female with fall and trauma to the back.

EXAM:
CT THORACIC SPINE WITHOUT CONTRAST
TECHNIQUE: Multidetector CT images of the thoracic were obtained using the
standard protocol without intravenous contrast.

[Series 12: sagittal bone · sagittal · 0.24mm/px · 2 of 61 slices shown]
[im 21/61  bone]
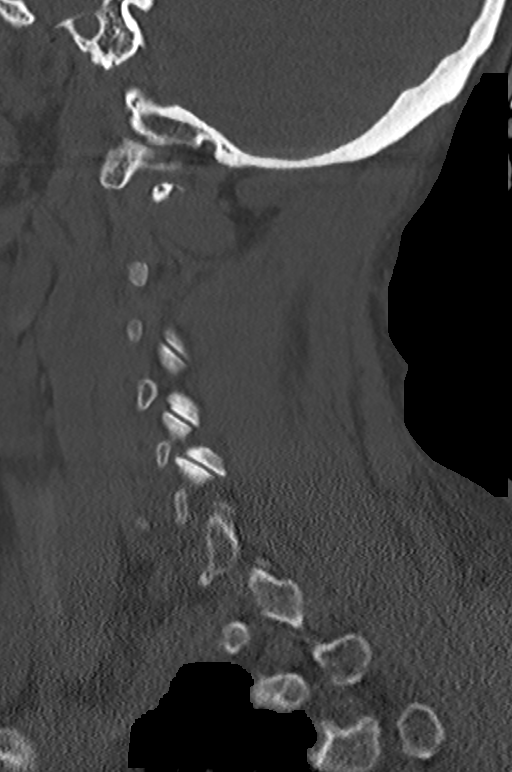
[im 41/61  bone]
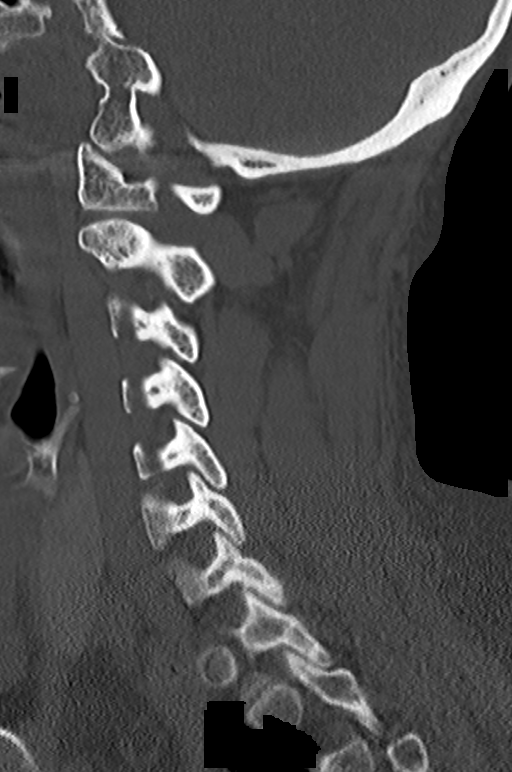

[Series 15: orthogonal axial st · axial · 0.21mm/px · z∈[-258,-196]mm · 2 of 91 slices shown]
[im 31/91  bone]
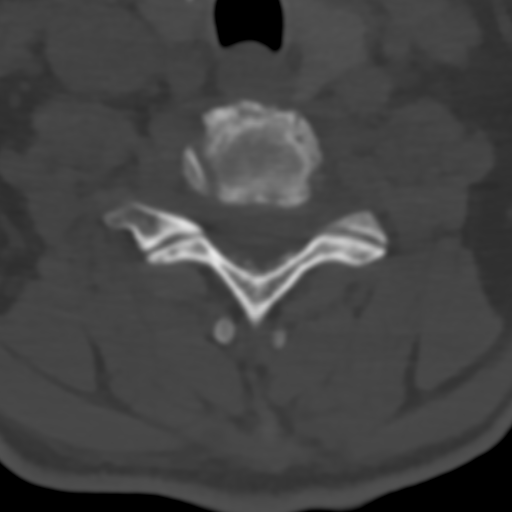
[im 61/91  bone]
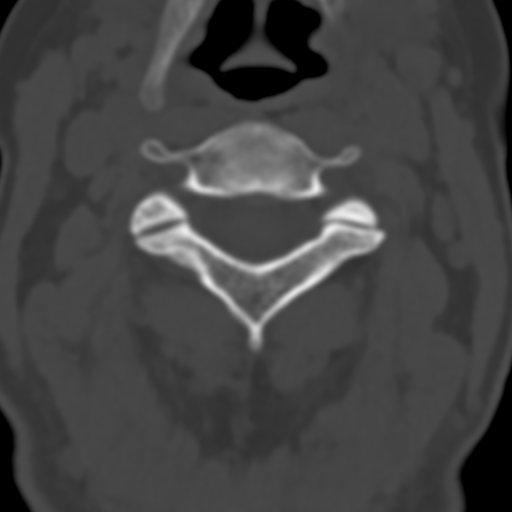

[Series 17: t-spine 2.0 st · axial · 0.27mm/px · z∈[-496,-322]mm · 4 of 146 slices shown, 5 images]
[im 30/146  soft-tissue]
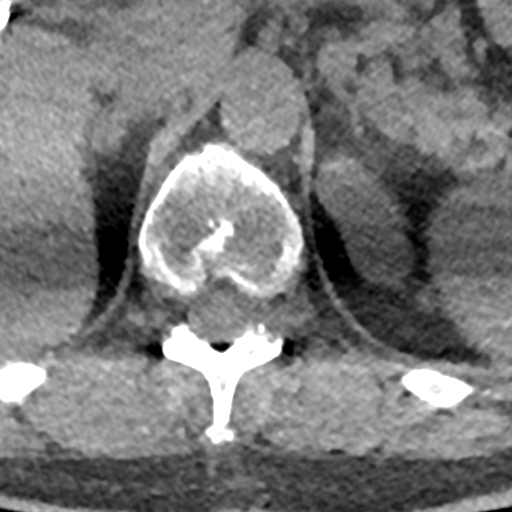
[im 30/146  bone]
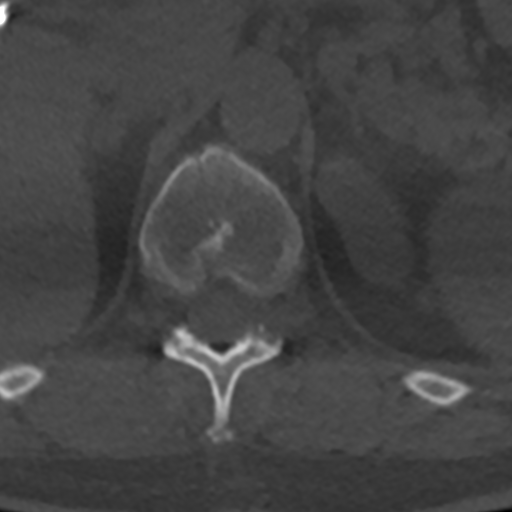
[im 59/146  bone]
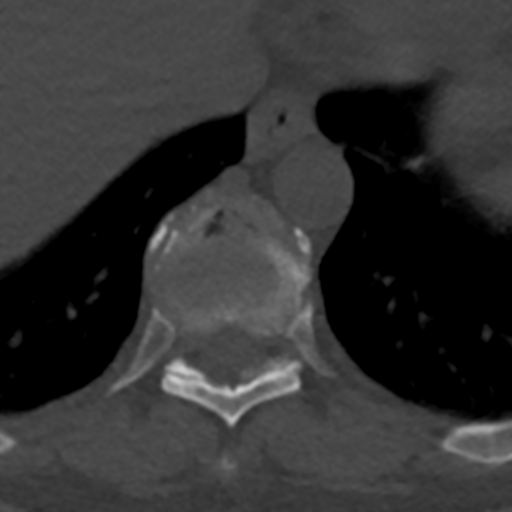
[im 88/146  bone]
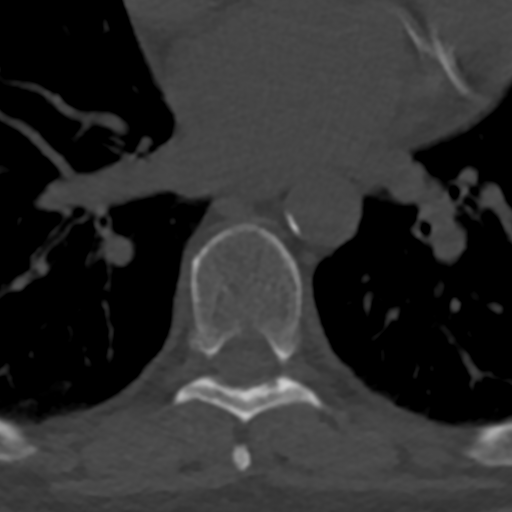
[im 117/146  bone]
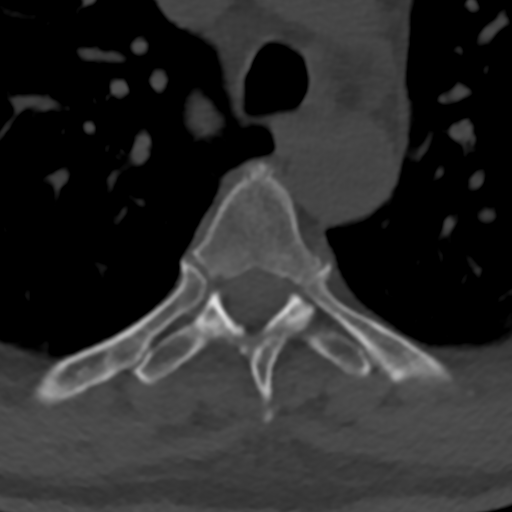

[8 of 33 positions shown; findings below may reference images not displayed]

FINDINGS: Alignment: Normal.

Vertebrae: No acute fracture or focal pathologic process. The bones
are osteopenic which limits evaluation for fracture.

Paraspinal and other soft tissues: Mild atherosclerotic
calcification of the aorta. There is coronary vascular
calcification.

Disc levels: No acute findings.
IMPRESSION: No acute/traumatic thoracic spine pathology.
